# Patient Record
Sex: Male | Born: 1985 | Race: White | Hispanic: No | Marital: Single | Smoking: Current every day smoker
Health system: Southern US, Community
[De-identification: ages and names within clinical notes are randomized; demographics above are authoritative.]

---

## 2011-07-09 ENCOUNTER — Emergency Department (HOSPITAL_COMMUNITY)
Admission: EM | Admit: 2011-07-09 | Discharge: 2011-07-09 | Disposition: A | Discharge status: Home / Self Care | Payer: Self-pay | Attending: Emergency Medicine | Admitting: Emergency Medicine

## 2011-07-09 ENCOUNTER — Encounter (HOSPITAL_COMMUNITY): Payer: Self-pay | Admitting: *Deleted

## 2011-07-09 ENCOUNTER — Emergency Department (HOSPITAL_COMMUNITY): Payer: Self-pay

## 2011-07-09 DIAGNOSIS — M25562 Pain in left knee: Secondary | ICD-10-CM

## 2011-07-09 DIAGNOSIS — IMO0002 Reserved for concepts with insufficient information to code with codable children: Secondary | ICD-10-CM | POA: Insufficient documentation

## 2011-07-09 DIAGNOSIS — M25569 Pain in unspecified knee: Secondary | ICD-10-CM | POA: Insufficient documentation

## 2011-07-09 DIAGNOSIS — M1710 Unilateral primary osteoarthritis, unspecified knee: Secondary | ICD-10-CM | POA: Insufficient documentation

## 2011-07-09 DIAGNOSIS — M1712 Unilateral primary osteoarthritis, left knee: Secondary | ICD-10-CM

## 2011-07-09 MED ORDER — NAPROXEN 250 MG PO TABS: 250.0000 mg | ORAL_TABLET | Freq: Two times a day (BID) | ORAL | Status: DC

## 2011-07-09 MED ORDER — HYDROCODONE-ACETAMINOPHEN 5-325 MG PO TABS: ORAL_TABLET | ORAL | Status: AC

## 2011-07-09 NOTE — ED Provider Notes (Signed)
History     CSN: [id number]  Arrival date & time 07/09/11  2027   First MD Initiated Contact with Patient 07/09/11 2134      Chief Complaint  Patient presents with  . Knee Pain     HPI Pt was seen at 2135.  Per pt, c/o gradual onset and persistence of constant acute flair of his chronic left knee "pain" for the past 1 week.  Denies new injury, no rash, no fevers, no focal motor weakness, no tingling/numbness in extremities.    History reviewed. No pertinent past medical history.  History reviewed. No pertinent past surgical history.   History  Substance Use Topics  . Smoking status: Never Smoker   . Smokeless tobacco: Not on file  . Alcohol Use: No     Review of Systems ROS: Statement: All systems negative except as marked or noted in the HPI; Constitutional: Negative for fever and chills. ; ; Eyes: Negative for eye pain, redness and discharge. ; ; ENMT: Negative for ear pain, hoarseness, nasal congestion, sinus pressure and sore throat. ; ; Cardiovascular: Negative for chest pain, palpitations, diaphoresis, dyspnea and peripheral edema. ; ; Respiratory: Negative for cough, wheezing and stridor. ; ; Gastrointestinal: Negative for nausea, vomiting, diarrhea, abdominal pain, blood in stool, hematemesis, jaundice and rectal bleeding. . ; ; Genitourinary: Negative for dysuria, flank pain and hematuria. ; ; Musculoskeletal: +left knee pain. Negative for back pain and neck pain. Negative for trauma.; ; Skin: Negative for pruritus, rash, abrasions, blisters, bruising and skin lesion.; ; Neuro: Negative for headache, lightheadedness and neck stiffness. Negative for weakness, altered level of consciousness , altered mental status, extremity weakness, paresthesias, involuntary movement, seizure and syncope.     Allergies  Review of patient's allergies indicates no known allergies.  Home Medications  No current outpatient prescriptions on file.  BP 131/76  Pulse 77  Temp(Src) 99 F  (37.2 C) (Oral)  Resp 19  SpO2 98%  Physical Exam 2140: Physical examination:  Nursing notes reviewed; Vital signs and O2 SAT reviewed;  Constitutional: Well developed, Well nourished, Well hydrated, In no acute distress; Head:  Normocephalic, atraumatic; Eyes: EOMI, PERRL, No scleral icterus; ENMT: Mouth and pharynx normal, Mucous membranes moist; Neck: Supple, Full range of motion, No lymphadenopathy; Cardiovascular: Regular rate and rhythm, No murmur, rub, or gallop; Respiratory: Breath sounds clear & equal bilaterally, No rales, rhonchi, wheezes, or rub, Normal respiratory effort/excursion; Chest: Nontender, Movement normal; Extremities: Pulses normal, +decrased ROM (F/E) left knee due to increasing pain.  Pt is able to lift extended LLE off stretcher, and extend left lower leg against resistance.  +mild ACL ligamentous laxity.  No patellar or quad tendon step-offs.  NMS intact left foot, strong pedal pp.  No proximal fibular head tenderness.  No edema, erythema, warmth, ecchymosis or deformity.  No specific area of point tenderness.,  No edema, No calf edema or asymmetry.; Neuro: AA&Ox3, Major CN grossly intact.  No gross focal motor or sensory deficits in extremities.; Skin: Color normal, Warm, Dry, no rash.    ED Course  Procedures   MDM  MDM Reviewed: nursing note and vitals Interpretation: x-ray   Dg Knee Complete 4 Views Left 07/09/2011  *RADIOLOGY REPORT*  Clinical Data: Left knee pain and swelling for 1 week, injury 5 years ago  LEFT KNEE - COMPLETE 4+ VIEW  Comparison: 01/26/2007  Findings: Well corticated ossific/calcific opacities are again noted adjacent to the medial distal femoral condyle.  Mild to moderate tricompartmental degenerative change noted.  No suprapatellar effusion.  No acute fracture or dislocation.  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  Original Report Authenticated By: Harrel Lemon, M.D.      9:46 PM:  Appears to be acute  flair of pt's chronic pain.  Has not f/u with his Ortho MD "in years."  Will give knee immobilizer, crutches, pain meds, and encouragement to f/u with Ortho MD.  Dx testing d/w pt and family.  Questions answered.  Verb understanding, agreeable to d/c home with outpt f/u.       Laray Anger, DO 07/13/11 1128

## 2011-07-09 NOTE — ED Notes (Signed)
He has lt knee pain for 7 days.  He had an injury 5 years ago and he has noticed some swelling  In that area. And he thinks there is fluid in the knee again

## 2011-07-09 NOTE — Discharge Instructions (Signed)
RESOURCE GUIDE  Chronic Pain Problems: Contact [facility]  [phone] Patients need to be referred by their primary care doctor.  Insufficient Money for Medicine: Contact United Way:  call "211" or [facility] [phone].  No Primary Care Doctor: - Call [facility]  [phone] - can help you locate a primary care doctor that  accepts your insurance, provides certain services, etc. - Physician Referral Service- 1-800-533-3463  Agencies that provide inexpensive medical care: - [facility]  [phone] - [facility]  [phone] - [facility]  [phone] - [facility]  [phone] - Planned Parenthood  [phone] - [facility]  [phone]  Medicaid-accepting [place] Providers: - [facility]- [address]  [phone], Mon-Fri 9am-7pm, Sat 9am-1pm - [facility]- [address], [address]  [phone] - [facility]- [address], [address]  [phone] - [facility]- [address] [place] [address]  [phone] - Veita Bland- [address], [phone]  Only accepts [place] Access Medicaid patients after they have their name  applied to their card  Self Pay (no insurance) in [place]: - Sickle Cell Patients: Terry Caldwell, [facility]  [address], [phone] - [facility]- [address]  [phone]       -     [facility] [place]- [address]       -     [facility]- see information above (Speak to Pam H if you do not have insurance)       -  [facility]- [address], [phone]       -  [facility] [place]- [address],  [phone]       -  [facility]- [address] [place] [address], [phone]       -  Terry Osei-[facility]onsu-  [address], [place], [phone]       -  [facility]- [address], [phone]       -  [facility]- [address] [place] [address], [phone], also [address], [phone]       -    [facility]- [address], [phone], 1st & 3rd Saturday   every month, 10am-1pm  1) Find a Doctor and Pay Out of Pocket Although you won't have to find out who is covered by your insurance plan, it is a good idea to ask around and get recommendations. You will then need to call the office and see if the doctor you have chosen will accept you as a new patient and what types of options they offer for patients who are self-pay. Some doctors offer discounts or will set up payment plans for their patients who do not have insurance, but you will need to ask so you aren't surprised when you get to your appointment.  2) Contact Your Local Health Department Not all health departments have doctors that can see patients for sick visits, but many do, so it is worth a call to see if yours does. If you don't know where your local health department is, you can check in your phone book. The CDC also has a tool to help you locate your state's health department, and many state websites also have   listings of all of their local health departments.  3) Find a Walk-in Clinic If your illness is not likely to be very severe or complicated, you may want to try a walk in clinic. These are popping up all over the country in pharmacies, drugstores, and shopping centers. They're usually staffed by nurse practitioners or physician assistants that have been trained to treat common illnesses and complaints. They're usually fairly quick and inexpensive. However, if you have serious medical issues or chronic medical problems, these are probably not your best option  STD Testing - [place] [facility] [place], [facility] Clinic, [address], [place], phone [phone] or 1-877-539-9860.  Monday - Friday, call for an appointment. - [place]  [facility] [place], [facility]D Clinic, [address], [place], phone [phone] or 1-877-539-9860.  Monday - Friday, call for an appointment.  Abuse/Neglect: - [place] Child Abuse Hotline (336) 641-3795 - [place] Child Abuse Hotline 800-378-5315 (After Hours)  Emergency Shelter:  [facility] (336) 271-5985  Maternity Homes: - [facility] (336) 275-9566 - [facility] (704) 372-4663  MRSA Hotline #:   832-7006  [place] Resources  [facility] [place]  [employer] [place] [facility] [address]                 [address]         [address]  [place]                                               [place]                              [place] Phone:  [phone]                                  Phone:  [phone]                   Phone:  [phone]  [place] [facility], [phone] - [place] Services - [facility]- 1-888-581-9988       -     [facility] in [place], [address],                                  336-349-4454, Insurance  [place] Child Abuse Hotline (336) 342-1394 or (336) 342-3537 (After Hours)   Behavioral Health Services  Substance Abuse Resources: - [facility]  336-882-2125 - [facility] 336-784-9470 - [facility] 336-285-9073 - [facility] 336-845-3988 - Residential & Outpatient Substance Abuse Program  800-659-3381  Psychological Services: -  Health  832-9600 - [facility]  [phone] - [place] [facility], [address], [place], ACCESS LINE: 1-800-853-5163 or 336-641-4981, Http://www.[facility].com/services/adult.htm  Dental Assistance  If unable to pay or uninsured, contact:  [facility] or [place] [facility] to become qualified for the adult dental  clinic.  Patients with Medicaid:  [facility] [address], [phone], [phone]  If unable   to pay, or uninsured, contact [facility] 613-798-7566) or [facility] Department [phone] in [place], [phone] in [facility]) to become qualified for the adult dental clinic  Other Low-Cost Community Dental Services: - [facility]- [address] [place], [place], [place], [address], [phone], [address], 2nd and 4th Thursday of the month at 6:30am.  10 clients each day by appointment, can sometimes see walk-in patients if someone does not show for an appointment. [facility]- [address] [place], [place], [address], [phone] - [facility]- [address], [place], [place], [address], [phone] [facility]- [phone] [facility] Health Department- [phone] [phone]    Take the prescriptions as directed.  Apply moist heat or ice to the area(s) of discomfort, for 15 minutes at a time, several times per day for the next few days.  Do not fall asleep on a heating or ice pack.  Call your Orthopedic doctor tomorrow morning to schedule a follow up appointment within the next week.  Return to the Emergency Department immediately if worsening.

## 2011-07-09 NOTE — Progress Notes (Signed)
Orthopedic Tech Progress Note Patient Details:  Terry Caldwell 1985-03-31 [id number]  Ortho Devices Type of Ortho Device: Knee Immobilizer Ortho Device/Splint Location: (L) Ortho Device/Splint Interventions: Application   [name] 07/09/2011, 9:49 PM

## 2011-12-17 ENCOUNTER — Emergency Department (HOSPITAL_COMMUNITY)
Admission: EM | Admit: 2011-12-17 | Discharge: 2011-12-18 | Disposition: A | Discharge status: Home / Self Care | Payer: Self-pay | Attending: Emergency Medicine | Admitting: Emergency Medicine

## 2011-12-17 DIAGNOSIS — Y9339 Activity, other involving climbing, rappelling and jumping off: Secondary | ICD-10-CM | POA: Insufficient documentation

## 2011-12-17 DIAGNOSIS — T148XXA Other injury of unspecified body region, initial encounter: Secondary | ICD-10-CM

## 2011-12-17 DIAGNOSIS — Y929 Unspecified place or not applicable: Secondary | ICD-10-CM | POA: Insufficient documentation

## 2011-12-17 DIAGNOSIS — W2209XA Striking against other stationary object, initial encounter: Secondary | ICD-10-CM | POA: Insufficient documentation

## 2011-12-17 DIAGNOSIS — S335XXA Sprain of ligaments of lumbar spine, initial encounter: Secondary | ICD-10-CM | POA: Insufficient documentation

## 2011-12-17 DIAGNOSIS — IMO0002 Reserved for concepts with insufficient information to code with codable children: Secondary | ICD-10-CM | POA: Insufficient documentation

## 2011-12-17 DIAGNOSIS — M549 Dorsalgia, unspecified: Secondary | ICD-10-CM

## 2011-12-17 MED ORDER — OXYCODONE-ACETAMINOPHEN 5-325 MG PO TABS
2.0000 | ORAL_TABLET | Freq: Once | ORAL | Status: AC
  Administered 2011-12-18: 2 via ORAL
  Filled 2011-12-17: qty 2

## 2011-12-17 NOTE — ED Notes (Addendum)
Patient was climbing a tree for work today around 1700 and slipped, he was wearing a safety harness. He states he fell and hit the trunk of the tree with his lower back. Patient reports having taken tylenol and using a heating pad at home PTA, with no improvement. Patient states he is unable to lay down. He is currently sitting in a straight back chair.

## 2011-12-17 NOTE — ED Notes (Signed)
Pt c/o back pain. Pt was cutting a tree with safety line 30 ft in the tree.pt slipped and the safety line caused the pt to strike the tree 56ft lower than intal point. Pt back struck tree. Pain 10/10

## 2011-12-18 ENCOUNTER — Emergency Department (HOSPITAL_COMMUNITY): Payer: Self-pay

## 2011-12-18 MED ORDER — CYCLOBENZAPRINE HCL 10 MG PO TABS: 10.0000 mg | ORAL_TABLET | Freq: Three times a day (TID) | ORAL | Status: DC | PRN

## 2011-12-18 MED ORDER — OXYCODONE-ACETAMINOPHEN 5-325 MG PO TABS: 1.0000 | ORAL_TABLET | ORAL | Status: DC | PRN

## 2011-12-18 NOTE — ED Provider Notes (Signed)
Medical screening examination/treatment/procedure(s) were performed by non-physician practitioner and as supervising physician I was immediately available for consultation/collaboration.  Olivia Mackie, MD 12/18/11 [phone]

## 2011-12-18 NOTE — ED Notes (Signed)
Discharge instructions reviewed. All questions answered.

## 2011-12-18 NOTE — ED Provider Notes (Signed)
History     CSN: [id number]  Arrival date & time 12/17/11  2247   First MD Initiated Contact with Patient 12/17/11 2323      Chief Complaint  Patient presents with  . Back Pain   HPI  History provided by the patient. Patient is a 26 year old male with no significant PMH who presents with complaints of low back pain and injury. 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 Patient reports colliding with the trunk of the tree over his low back and left side. Patient was able to lower himself to the ground and went to his pickup truck to rest. He reports having increasing low back pain approximately 20 minutes after the injury. Pain has continued to worsen and be persistent. Pain does not radiate. He denies any weakness or numbness in lower extremities. Denies any urinary or fecal incontinence, urinary retention or perineal numbness. He denies any dysuria, hematuria, urinary frequency. He denies any chest pain or shortness of breath.    No past medical history on file.  No past surgical history on file.  No family history on file.  History  Substance Use Topics  . Smoking status: Never Smoker   . Smokeless tobacco: Not on file  . Alcohol Use: No      Review of Systems  HENT: Negative for neck pain.   Gastrointestinal: Negative for nausea, vomiting, abdominal pain and diarrhea.  Genitourinary: Negative for dysuria, frequency, hematuria and flank pain.  Musculoskeletal: Positive for back pain.  Neurological: Negative for weakness, light-headedness, numbness and headaches.    Allergies  Review of patient's allergies indicates no known allergies.  Home Medications   Current Outpatient Rx  Name  Route  Sig  Dispense  Refill  . ACETAMINOPHEN 500 MG PO TABS   Oral   Take 1,000 mg by mouth every 6 (six) hours as needed. Pain.            BP 144/79  Temp 98.5 F (36.9 C) (Oral)  Resp 20  SpO2 97%  Physical Exam  Nursing note and vitals reviewed. Constitutional: He is oriented to person, place, and time. He appears well-developed and well-nourished. No distress.  HENT:  Head: Normocephalic and atraumatic.  Cardiovascular: Normal rate and regular rhythm.   Pulmonary/Chest: Effort normal and breath sounds normal. No respiratory distress. He has no wheezes.  Abdominal: Soft. He exhibits no distension. There is no tenderness. There is no rebound and no guarding.       No CVA tenderness  Musculoskeletal: Normal range of motion. He exhibits no edema and no tenderness.       Cervical back: Normal.       Thoracic back: Normal.       Lumbar back: He exhibits tenderness and bony tenderness. He exhibits no swelling, no edema and no deformity.       Back:  Neurological: He is alert and oriented to person, place, and time. He has normal strength. No sensory deficit. Gait normal.  Skin: Skin is warm.  Psychiatric: He has a normal mood and affect. His behavior is normal.    ED Course  Procedures   Dg Lumbar Spine Complete  12/18/2011  *RADIOLOGY REPORT*  Clinical Data: Low back pain status post trauma.  LUMBAR SPINE - COMPLETE 4+ VIEW  Comparison: None.  Findings: The imaged vertebral  bodies and inter-vertebral disc spaces are maintained. No displaced acute fracture or dislocation identified.   The para-vertebral and overlying soft tissues are within normal limits.  IMPRESSION: No acute osseous abnormality of the lumbar spine.   Original Report Authenticated By: Jearld Lesch, M.D.      1. Back pain   2. Muscle strain       MDM  Patient seen and evaluated. Patient appears uncomfortable but in no acute distress.  X-rays unremarkable without signs of fracture or other concerning injuries. Plan to discharge at this time and treat symptomatically.       Angus Seller, PA 12/18/11 0425

## 2011-12-21 ENCOUNTER — Encounter (HOSPITAL_COMMUNITY): Payer: Self-pay | Admitting: Emergency Medicine

## 2012-02-11 ENCOUNTER — Emergency Department (HOSPITAL_COMMUNITY): Payer: Self-pay

## 2012-02-11 ENCOUNTER — Encounter (HOSPITAL_COMMUNITY): Payer: Self-pay | Admitting: *Deleted

## 2012-02-11 ENCOUNTER — Emergency Department (HOSPITAL_COMMUNITY)
Admission: EM | Admit: 2012-02-11 | Discharge: 2012-02-12 | Disposition: A | Discharge status: Home / Self Care | Payer: Self-pay | Attending: Emergency Medicine | Admitting: Emergency Medicine

## 2012-02-11 DIAGNOSIS — H53149 Visual discomfort, unspecified: Secondary | ICD-10-CM | POA: Insufficient documentation

## 2012-02-11 DIAGNOSIS — R51 Headache: Secondary | ICD-10-CM | POA: Insufficient documentation

## 2012-02-11 DIAGNOSIS — R112 Nausea with vomiting, unspecified: Secondary | ICD-10-CM | POA: Insufficient documentation

## 2012-02-11 DIAGNOSIS — R030 Elevated blood-pressure reading, without diagnosis of hypertension: Secondary | ICD-10-CM | POA: Insufficient documentation

## 2012-02-11 DIAGNOSIS — R1110 Vomiting, unspecified: Secondary | ICD-10-CM

## 2012-02-11 DIAGNOSIS — H538 Other visual disturbances: Secondary | ICD-10-CM | POA: Insufficient documentation

## 2012-02-11 DIAGNOSIS — R42 Dizziness and giddiness: Secondary | ICD-10-CM | POA: Insufficient documentation

## 2012-02-11 LAB — CBC
HCT: 48.3 % (ref 39.0–52.0)
Platelets: 219 10*3/uL (ref 150–400)
RDW: 12.7 % (ref 11.5–15.5)
WBC: 9.1 10*3/uL (ref 4.0–10.5)

## 2012-02-11 LAB — BASIC METABOLIC PANEL
Chloride: 98 mEq/L (ref 96–112)
GFR calc Af Amer: >90 mL/min (ref >90)
Potassium: 3.8 mEq/L (ref 3.5–5.1)
Sodium: 138 mEq/L (ref 135–145)

## 2012-02-11 MED ORDER — METOCLOPRAMIDE HCL 5 MG/ML IJ SOLN
10.0000 mg | Freq: Once | INTRAMUSCULAR | Status: AC
  Administered 2012-02-11: 10 mg via INTRAVENOUS

## 2012-02-11 MED ORDER — KETOROLAC TROMETHAMINE 30 MG/ML IJ SOLN
30.0000 mg | Freq: Once | INTRAMUSCULAR | Status: AC
  Administered 2012-02-11: 30 mg via INTRAVENOUS
  Filled 2012-02-11: qty 1

## 2012-02-11 MED ORDER — SODIUM CHLORIDE 0.9 % IV BOLUS (SEPSIS)
1000.0000 mL | Freq: Once | INTRAVENOUS | Status: AC
  Administered 2012-02-11: 1000 mL via INTRAVENOUS

## 2012-02-11 MED ORDER — ONDANSETRON HCL 4 MG/2ML IJ SOLN
4.0000 mg | Freq: Once | INTRAMUSCULAR | Status: AC
  Administered 2012-02-11: 4 mg via INTRAVENOUS
  Filled 2012-02-11: qty 2

## 2012-02-11 MED ORDER — METOCLOPRAMIDE HCL 5 MG/ML IJ SOLN
10.0000 mg | Freq: Once | INTRAMUSCULAR | Status: DC
  Filled 2012-02-11: qty 2

## 2012-02-11 MED ORDER — DIPHENHYDRAMINE HCL 50 MG/ML IJ SOLN
25.0000 mg | Freq: Once | INTRAMUSCULAR | Status: AC
  Administered 2012-02-11: 25 mg via INTRAVENOUS
  Filled 2012-02-11: qty 1

## 2012-02-11 NOTE — ED Notes (Addendum)
Pt. Transported to CT 

## 2012-02-11 NOTE — ED Provider Notes (Signed)
History     CSN: [id number]  Arrival date & time 02/11/12  2050   First MD Initiated Contact with Patient 02/11/12 2124      Chief Complaint  Patient presents with  . Headache  . Emesis    (Consider location/radiation/quality/duration/timing/severity/associated sxs/prior treatment) HPI Comments: Terry Caldwell is a 27 y.o. Male w no sig PMhx presents to ER c/o severe HA and hyper emesis. Onset of s/s began at about 6pm with an acute onset. Symptoms are moderate. Associated symptoms included nausea, photophobia, sound sensitivity, blurred vision, & dizziness. HA pain located in frontal lobes & described as throbbing sensation with intermittent sharp spikes. Pt states that he normally does not suffer from HA. BP was checked at home and found to be 157/104, and he denies history of suffering from high BP. Pt denies ataxia, dysequilibrium, double vision, unilateral weakness, fevers, night sweats, weight loss, neck pain, head trauma or syncope    The history is provided by the patient.    History reviewed. No pertinent past medical history.  History reviewed. No pertinent past surgical history.  No family history on file.  History  Substance Use Topics  . Smoking status: Never Smoker   . Smokeless tobacco: Not on file  . Alcohol Use: No      Review of Systems  All other systems reviewed and are negative.    Allergies  Review of patient's allergies indicates no known allergies.  Home Medications   Current Outpatient Rx  Name  Route  Sig  Dispense  Refill  . ACETAMINOPHEN 500 MG PO TABS   Oral   Take 1,000 mg by mouth every 6 (six) hours as needed. Pain.         . CYCLOBENZAPRINE HCL 10 MG PO TABS   Oral   Take 1 tablet (10 mg total) by mouth 3 (three) times daily as needed for muscle spasms.   30 tablet   0     BP 136/82  Pulse 92  Temp 97.8 F (36.6 C) (Oral)  Resp 22  SpO2 99%  Physical Exam  Nursing note and vitals reviewed. Constitutional: He is  oriented to person, place, and time. He appears well-developed and well-nourished. He appears distressed.  HENT:  Head: Normocephalic and atraumatic.  Eyes: Conjunctivae normal and EOM are normal. Pupils are equal, round, and reactive to light. No scleral icterus.  Neck: Normal range of motion.       Neck supple with no nuchal rigidity, full pain free ROM. No carotid bruit  Cardiovascular: Normal rate, regular rhythm, normal heart sounds and intact distal pulses.   Pulmonary/Chest: Effort normal and breath sounds normal. No respiratory distress.  Musculoskeletal: Normal range of motion.  Neurological: He is alert and oriented to person, place, and time. He has normal strength.       CN III-XII intact, good coordination, normal gait, strength 5/5 bilaterally, intact distal sensation.  Skin: Skin is warm and dry.       No rash, non diaphoretic    ED Course  Procedures (including critical care time)  Labs Reviewed  CBC - Abnormal; Notable for the following:    Hemoglobin 17.4 (*)     All other components within normal limits  BASIC METABOLIC PANEL  URINALYSIS, ROUTINE W REFLEX MICROSCOPIC   Ct Head Wo Contrast  02/11/2012  *RADIOLOGY REPORT*  Clinical Data: Severe headache, hypertension, vomiting  CT HEAD WITHOUT CONTRAST  Technique:  Contiguous axial images were obtained from the base of the  skull through the vertex without contrast.  Comparison: None.  Findings:  Wallace Cullens white differentiation is maintained.  No CT evidence of acute large territory infarct.  No intraparenchymal or extra-axial mass or hemorrhage.  Normal size and configuration of the ventricles and basilar cisterns.  No midline shift.  There is a minimal amount of mucosal thickening within the left maxillary sinus without air fluid level.  The remaining paranasal sinuses and mastoid air cells are normally aerated.  The regional soft tissues are normal.  No displaced calvarial fracture.  IMPRESSION: 1.  Negative noncontrast head  CT. 2.  Minimal mucosal thickening within the left maxillary sinus without air fluid level.   Original Report Authenticated By: Tacey Ruiz, MD      No diagnosis found.    MDM  Pt to ER c/o of HA c/w migraine, but no hx of similar presentation. CT Imaging preformed without acute abnormality. Pt HA treated and improved while in ED.  Non concerning for [facility] ICH, Meningitis, or temporal arteritis. Pt is afebrile with no focal neuro deficits, nuchal rigidity, or change in vision. Pt is to follow up with PCP to discuss prophylactic medication. Pt verbalizes understanding and is agreeable with plan to dc. Resource guide given at [employer], [place] 02/11/12 2346

## 2012-02-11 NOTE — ED Notes (Signed)
Pt actively vomiting in triage; pt c/o severe headache; bp 157/104 at home; migraine x 4 hours; vomiting x 3 hours; cold sweats

## 2012-02-11 NOTE — ED Provider Notes (Signed)
Medical screening examination/treatment/procedure(s) were performed by non-physician practitioner and as supervising physician I was immediately available for consultation/collaboration.   Dione Booze, MD 02/11/12 2351

## 2012-02-12 LAB — URINALYSIS, ROUTINE W REFLEX MICROSCOPIC
Leukocytes, UA: NEGATIVE
Nitrite: NEGATIVE
Specific Gravity, Urine: 1.037 — ABNORMAL HIGH (ref 1.005–1.030)
pH: 6.5 (ref 5.0–8.0)

## 2012-11-17 ENCOUNTER — Emergency Department (HOSPITAL_COMMUNITY): Payer: Medicaid Other

## 2012-11-17 ENCOUNTER — Encounter (HOSPITAL_COMMUNITY): Payer: Self-pay | Admitting: Emergency Medicine

## 2012-11-17 ENCOUNTER — Emergency Department (HOSPITAL_COMMUNITY)
Admission: EM | Admit: 2012-11-17 | Discharge: 2012-11-17 | Disposition: A | Discharge status: Home / Self Care | Payer: Medicaid Other | Attending: Emergency Medicine | Admitting: Emergency Medicine

## 2012-11-17 DIAGNOSIS — M549 Dorsalgia, unspecified: Secondary | ICD-10-CM

## 2012-11-17 DIAGNOSIS — Y9289 Other specified places as the place of occurrence of the external cause: Secondary | ICD-10-CM | POA: Insufficient documentation

## 2012-11-17 DIAGNOSIS — X503XXA Overexertion from repetitive movements, initial encounter: Secondary | ICD-10-CM | POA: Insufficient documentation

## 2012-11-17 DIAGNOSIS — Y990 Civilian activity done for income or pay: Secondary | ICD-10-CM | POA: Insufficient documentation

## 2012-11-17 DIAGNOSIS — IMO0002 Reserved for concepts with insufficient information to code with codable children: Secondary | ICD-10-CM | POA: Insufficient documentation

## 2012-11-17 DIAGNOSIS — Y9389 Activity, other specified: Secondary | ICD-10-CM | POA: Insufficient documentation

## 2012-11-17 MED ORDER — CYCLOBENZAPRINE HCL 10 MG PO TABS: 10.0000 mg | ORAL_TABLET | Freq: Two times a day (BID) | ORAL | Status: AC | PRN

## 2012-11-17 MED ORDER — KETOROLAC TROMETHAMINE 60 MG/2ML IM SOLN
60.0000 mg | Freq: Once | INTRAMUSCULAR | Status: AC
  Administered 2012-11-17: 60 mg via INTRAMUSCULAR
  Filled 2012-11-17: qty 2

## 2012-11-17 MED ORDER — DEXAMETHASONE SODIUM PHOSPHATE 10 MG/ML IJ SOLN
10.0000 mg | Freq: Once | INTRAMUSCULAR | Status: AC
  Administered 2012-11-17: 10 mg via INTRAMUSCULAR
  Filled 2012-11-17: qty 1

## 2012-11-17 MED ORDER — OXYCODONE-ACETAMINOPHEN 10-325 MG PO TABS: 0.5000 | ORAL_TABLET | ORAL | Status: DC | PRN

## 2012-11-17 NOTE — ED Provider Notes (Signed)
CSN: [id number]     Arrival date & time 11/17/12  1240 History   This chart was scribed for non-physician practitioner Arthor Captain, PA-C working with Vida Roller, MD by Joaquin Music, ED Scribe. This patient was seen in room WTR8/WTR8 and the patient's care was started at 2:32 PM .   Chief Complaint  Patient presents with  . Back Pain    The history is provided by the patient. No language interpreter was used.   HPI Comments: Terry Caldwell is a 27 y.o. male who presents to the Emergency Department complaining of ongoing, worsening back pain with pain that radiates down L extremity onset 3 hours. Pt states he was moving a seat from the [place] and he felt a "pop" in his back. Pt states he is having throbbing back pain that radiates down L extremity. Pt is unable to lean back in seat due to pain. Pt denies loss of bowel and bladder function. Pt denies IV drug use. Pt states he has had this previously occur. 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 Pt denies any other injuries.    History reviewed. No pertinent past medical history. History reviewed. No pertinent past surgical history. History reviewed. No pertinent family history. History  Substance Use Topics  . Smoking status: Never Smoker   . Smokeless tobacco: Current User    Types: Chew  . Alcohol Use: No    Review of Systems  Gastrointestinal: Negative for vomiting.  Musculoskeletal: Positive for back pain and gait problem. Negative for neck stiffness.  Skin: Negative for rash.  Neurological: Negative for weakness, light-headedness and numbness.  All other systems reviewed and are negative.    Allergies  Review of patient's allergies indicates no known allergies.  Home Medications   Current Outpatient Rx  Name  Route  Sig  Dispense  Refill  . acetaminophen (TYLENOL) 500 MG tablet   Oral   Take 1,000 mg by mouth every 6 (six) hours as needed. Pain.          BP 121/69  Pulse 79  Temp(Src) 98.3 F (36.8  C) (Oral)  Resp 17  SpO2 99%  Physical Exam  Constitutional: He is oriented to person, place, and time. He appears well-developed and well-nourished. No distress.  HENT:  Head: Normocephalic and atraumatic.  Right Ear: External ear normal.  Left Ear: External ear normal.  Nose: Nose normal.  Eyes: Conjunctivae are normal. Pupils are equal, round, and reactive to light.  Neck: Neck supple.  Pulmonary/Chest: Effort normal.  Musculoskeletal:  Tender to palpation to Lumbar paraspinal and over SI joint. ROM limited due to pain. DTR normal. Neurovascularly intact.   Neurological: He is alert and oriented to person, place, and time.  Skin: Skin is warm and dry. He is not diaphoretic.  Psychiatric: He has a normal mood and affect.    ED Course  Procedures  DIAGNOSTIC STUDIES: Oxygen Saturation is 99% on RA, normal by my interpretation.    COORDINATION OF CARE: 2:34 PM-Discussed treatment plan which includes administering steroid shot to area and prescribing pt with pain medication and muscle relaxer. Pt agreed to plan.   Labs Review Labs Reviewed - No data to display Imaging Review No results found.  EKG Interpretation   None       MDM   1. Back pain    Patient with back pain.  No neurological deficits and normal neuro exam.  Patient can walk but states is painful.  No loss of bowel or  bladder control.  No concern for cauda equina.  No fever, night sweats, weight loss, h/o cancer, IVDU.  RICE protocol and pain medicine indicated and discussed with patient.    I personally performed the services described in this documentation, which was scribed in my presence. The recorded information has been reviewed and is accurate.    Arthor Captain, PA-C 11/17/12 2107

## 2012-11-17 NOTE — ED Notes (Signed)
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

## 2012-11-17 NOTE — Progress Notes (Signed)
[facility] provided pt with a list of primary care resources. Patient stated that he was pending Medicaid.  °

## 2012-11-21 NOTE — ED Provider Notes (Signed)
Medical screening examination/treatment/procedure(s) were performed by non-physician practitioner and as supervising physician I was immediately available for consultation/collaboration.    Vida Roller, MD 11/21/12 [phone]

## 2013-02-14 ENCOUNTER — Emergency Department (HOSPITAL_COMMUNITY): Payer: Medicaid Other

## 2013-02-14 ENCOUNTER — Encounter (HOSPITAL_COMMUNITY): Payer: Self-pay | Admitting: Emergency Medicine

## 2013-02-14 ENCOUNTER — Emergency Department (HOSPITAL_COMMUNITY)
Admission: EM | Admit: 2013-02-14 | Discharge: 2013-02-14 | Disposition: A | Discharge status: Home / Self Care | Payer: Medicaid Other | Attending: Emergency Medicine | Admitting: Emergency Medicine

## 2013-02-14 DIAGNOSIS — R1031 Right lower quadrant pain: Secondary | ICD-10-CM | POA: Insufficient documentation

## 2013-02-14 DIAGNOSIS — R112 Nausea with vomiting, unspecified: Secondary | ICD-10-CM | POA: Insufficient documentation

## 2013-02-14 DIAGNOSIS — R109 Unspecified abdominal pain: Secondary | ICD-10-CM

## 2013-02-14 DIAGNOSIS — Z79899 Other long term (current) drug therapy: Secondary | ICD-10-CM | POA: Insufficient documentation

## 2013-02-14 LAB — COMPREHENSIVE METABOLIC PANEL
ALBUMIN: 3.9 g/dL (ref 3.5–5.2)
ALT: 34 U/L (ref 0–53)
AST: 20 U/L (ref 0–37)
Alkaline Phosphatase: 61 U/L (ref 39–117)
BUN: 14 mg/dL (ref 6–23)
CALCIUM: 8.7 mg/dL (ref 8.4–10.5)
CO2: 29 mEq/L (ref 19–32)
CREATININE: 0.98 mg/dL (ref 0.50–1.35)
Chloride: 102 mEq/L (ref 96–112)
GFR calc Af Amer: >90 mL/min (ref >90)
Glucose, Bld: 89 mg/dL (ref 70–99)
Potassium: 4 mEq/L (ref 3.7–5.3)
Sodium: 141 mEq/L (ref 137–147)
TOTAL PROTEIN: 6.5 g/dL (ref 6.0–8.3)
Total Bilirubin: 0.9 mg/dL (ref 0.3–1.2)

## 2013-02-14 LAB — URINALYSIS, ROUTINE W REFLEX MICROSCOPIC
Bilirubin Urine: NEGATIVE
GLUCOSE, UA: NEGATIVE mg/dL
Hgb urine dipstick: NEGATIVE
KETONES UR: NEGATIVE mg/dL
LEUKOCYTES UA: NEGATIVE
NITRITE: NEGATIVE
PH: 8 (ref 5.0–8.0)
Protein, ur: 30 mg/dL — AB
SPECIFIC GRAVITY, URINE: 1.031 — AB (ref 1.005–1.030)
Urobilinogen, UA: 1 mg/dL (ref 0.0–1.0)

## 2013-02-14 LAB — CBC WITH DIFFERENTIAL/PLATELET
Basophils Absolute: 0 10*3/uL (ref 0.0–0.1)
Basophils Relative: 0 % (ref 0–1)
Eosinophils Absolute: 0.1 10*3/uL (ref 0.0–0.7)
Eosinophils Relative: 2 % (ref 0–5)
HEMATOCRIT: 49.6 % (ref 39.0–52.0)
HEMOGLOBIN: 17.5 g/dL — AB (ref 13.0–17.0)
LYMPHS ABS: 1.9 10*3/uL (ref 0.7–4.0)
LYMPHS PCT: 30 % (ref 12–46)
MCH: 30.4 pg (ref 26.0–34.0)
MCHC: 35.3 g/dL (ref 30.0–36.0)
MCV: 86.1 fL (ref 78.0–100.0)
MONO ABS: 0.6 10*3/uL (ref 0.1–1.0)
MONOS PCT: 9 % (ref 3–12)
NEUTROS ABS: 3.6 10*3/uL (ref 1.7–7.7)
NEUTROS PCT: 59 % (ref 43–77)
Platelets: 221 10*3/uL (ref 150–400)
RBC: 5.76 MIL/uL (ref 4.22–5.81)
RDW: 13 % (ref 11.5–15.5)
WBC: 6.2 10*3/uL (ref 4.0–10.5)

## 2013-02-14 LAB — URINE MICROSCOPIC-ADD ON

## 2013-02-14 LAB — LIPASE, BLOOD: Lipase: 15 U/L (ref 11–59)

## 2013-02-14 MED ORDER — SODIUM CHLORIDE 0.9 % IV SOLN: 1000.0000 mL | Freq: Once | INTRAVENOUS | Status: DC

## 2013-02-14 MED ORDER — SODIUM CHLORIDE 0.9 % IV SOLN
1000.0000 mL | INTRAVENOUS | Status: DC
Start: 2013-02-14 — End: 2013-02-14
  Administered 2013-02-14 (×2): 1000 mL via INTRAVENOUS

## 2013-02-14 MED ORDER — IOHEXOL 300 MG/ML  SOLN
50.0000 mL | Freq: Once | INTRAMUSCULAR | Status: AC | PRN
  Administered 2013-02-14: 50 mL via ORAL

## 2013-02-14 MED ORDER — ONDANSETRON 8 MG PO TBDP: 8.0000 mg | ORAL_TABLET | Freq: Three times a day (TID) | ORAL | Status: AC | PRN

## 2013-02-14 MED ORDER — OMEPRAZOLE 20 MG PO CPDR
20.0000 mg | DELAYED_RELEASE_CAPSULE | Freq: Every day | ORAL | Status: AC
Start: 2013-02-14 — End: ?

## 2013-02-14 MED ORDER — HYDROMORPHONE HCL PF 1 MG/ML IJ SOLN
1.0000 mg | INTRAMUSCULAR | Status: DC | PRN
  Administered 2013-02-14: 1 mg via INTRAVENOUS
  Filled 2013-02-14: qty 1

## 2013-02-14 MED ORDER — ONDANSETRON HCL 4 MG/2ML IJ SOLN
4.0000 mg | Freq: Once | INTRAMUSCULAR | Status: AC
  Administered 2013-02-14: 4 mg via INTRAVENOUS
  Filled 2013-02-14: qty 2

## 2013-02-14 MED ORDER — IOHEXOL 300 MG/ML  SOLN
100.0000 mL | Freq: Once | INTRAMUSCULAR | Status: AC | PRN
  Administered 2013-02-14: 100 mL via INTRAVENOUS

## 2013-02-14 NOTE — ED Provider Notes (Signed)
CSN: [id number]     Arrival date & time 02/14/13  1010 History   First MD Initiated Contact with Patient 02/14/13 1128     Chief Complaint  Patient presents with  . Abdominal Pain  . Nausea  . Emesis    HPI Patient presents to the emergency room with complaints of abdominal pain and vomiting for the last 3 days. Patient does have history of intermittent issues with vomiting a few times a week for the last several years. Patient had been evaluated before. However, in the last 3 days he's had a significant change for his and vomiting multiple times per day. Maybe ten- 20 times the last 3 days. He has not been able to keep down any food or fluids. He denies any diarrhea or constipation. Patient's also started developed pain in his right lower abdomen. He does have pain in his back as well. Denies any dysuria. He denies any fevers. History reviewed. No pertinent past medical history. History reviewed. No pertinent past surgical history. No family history on file. History  Substance Use Topics  . Smoking status: Never Smoker   . Smokeless tobacco: Current User    Types: Chew  . Alcohol Use: No    Review of Systems  All other systems reviewed and are negative.    Allergies  Review of patient's allergies indicates no known allergies.  Home Medications   Current Outpatient Rx  Name  Route  Sig  Dispense  Refill  . acetaminophen (TYLENOL) 500 MG tablet   Oral   Take 1,000 mg by mouth every 6 (six) hours as needed. Pain.         . cyclobenzaprine (FLEXERIL) 10 MG tablet   Oral   Take 1 tablet (10 mg total) by mouth 2 (two) times daily as needed for muscle spasms.   20 tablet   0   . omeprazole (PRILOSEC) 20 MG capsule   Oral   Take 1 capsule (20 mg total) by mouth daily.   14 capsule   0   . ondansetron (ZOFRAN ODT) 8 MG disintegrating tablet   Oral   Take 1 tablet (8 mg total) by mouth every 8 (eight) hours as needed for nausea or vomiting.   20 tablet   0    BP  140/83  Pulse 85  Temp(Src) 98.6 F (37 C) (Oral)  Resp 20  Ht 6\' 2"  (1.88 m)  Wt 237 lb (107.502 kg)  BMI 30.42 kg/m2  SpO2 98% Physical Exam  Nursing note and vitals reviewed. Constitutional: He appears well-developed and well-nourished. No distress.  HENT:  Head: Normocephalic and atraumatic.  Right Ear: External ear normal.  Left Ear: External ear normal.  Eyes: Conjunctivae are normal. Right eye exhibits no discharge. Left eye exhibits no discharge. No scleral icterus.  Neck: Neck supple. No tracheal deviation present.  Cardiovascular: Normal rate, regular rhythm and intact distal pulses.   Pulmonary/Chest: Effort normal and breath sounds normal. No stridor. No respiratory distress. He has no wheezes. He has no rales.  Abdominal: Soft. Bowel sounds are normal. He exhibits no distension. There is tenderness in the right lower quadrant. There is no rigidity, no rebound and no guarding. No hernia.  Musculoskeletal: He exhibits no edema and no tenderness.  Neurological: He is alert. He has normal strength. No cranial nerve deficit (no facial droop, extraocular movements intact, no slurred speech) or sensory deficit. He exhibits normal muscle tone. He displays no seizure activity. Coordination normal.  Skin: Skin is  warm and dry. No rash noted.  Psychiatric: He has a normal mood and affect.    ED Course  Procedures (including critical care time) Labs Review Labs Reviewed  URINALYSIS, ROUTINE W REFLEX MICROSCOPIC - Abnormal; Notable for the following:    Color, Urine AMBER (*)    Specific Gravity, Urine 1.031 (*)    Protein, ur 30 (*)    All other components within normal limits  CBC WITH DIFFERENTIAL - Abnormal; Notable for the following:    Hemoglobin 17.5 (*)    All other components within normal limits  URINE MICROSCOPIC-ADD ON  COMPREHENSIVE METABOLIC PANEL  LIPASE, BLOOD   Imaging Review Ct Abdomen Pelvis W Contrast  02/14/2013   CLINICAL DATA:  Right lower quadrant  abdominal pain with intermittent nausea and vomiting.  EXAM: CT ABDOMEN AND PELVIS WITH CONTRAST  TECHNIQUE: Multidetector CT imaging of the abdomen and pelvis was performed using the standard protocol following bolus administration of intravenous contrast.  CONTRAST:  50mL OMNIPAQUE IOHEXOL 300 MG/ML SOLN, OMNIPAQUE IOHEXOL 300 MG/ML SOLN  COMPARISON:  No priors.  FINDINGS: Lung Bases: Unremarkable.  Abdomen/Pelvis: The appearance of the liver, gallbladder, pancreas, spleen, bilateral adrenal glands and bilateral kidneys is unremarkable. No significant volume of ascites. No pneumoperitoneum. No pathologic distention of small bowel. Normal appendix (retrocecal). No definite lymphadenopathy identified within the abdomen or pelvis. Prostate gland and urinary bladder are unremarkable in appearance.  Musculoskeletal: There are no aggressive appearing lytic or blastic lesions noted in the visualized portions of the skeleton.  IMPRESSION: 1. No acute findings in the abdomen or pelvis to account for the patient's symptoms. 2. Specifically, the appendix is normal.   Electronically Signed   By: Trudie Reed M.D.   On: 02/14/2013 15:00    EKG Interpretation   None      1343  Pt feeling better with treatment.  On exam, still has ttp rlq.  Will CT scan to evaluate for appendicitis,. MDM   1. Abdominal pain   2. Nausea and vomiting      Workup in the ED is reassuring.  No appendicitis or other acute process on CT.  Will dc home with antacids and antiemetics.  Rec follow up with a PCP, consider GI consultation.    Celene Kras, MD 02/14/13 [phone]

## 2013-02-14 NOTE — ED Notes (Signed)
Resting quietly in bed, wife at bedside, side rails up times 2/  No acute distress noted, no active emesis.

## 2013-02-14 NOTE — ED Notes (Signed)
Pt states for past 3 days he has had intense RLQ pain and lower sacral pain.  Pt states he isn't sure the 2 pain areas are related.  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  Pt also states he has had intermittent N/V for 5 years, but patient states it has gotten worse this week and for the last 3-4 days he has been unable to tolerate oral intake.  Pt's wife states pt has had "projectile" vomiting for last several days.  Pt states he is "fine as long as I don't eat or drink."  Pt denies fever.

## 2013-02-14 NOTE — Discharge Instructions (Signed)
Abdominal Pain, Adult °Many things can cause abdominal pain. Usually, abdominal pain is not caused by a disease and will improve without treatment. It can often be observed and treated at home. Your health care provider will do a physical exam and possibly order blood tests and X-rays to help determine the seriousness of your pain. However, in many cases, more time must pass before a clear cause of the pain can be found. Before that point, your health care provider may not know if you need more testing or further treatment. °HOME CARE INSTRUCTIONS  °Monitor your abdominal pain for any changes. The following actions may help to alleviate any discomfort you are experiencing: °· Only take over-the-counter or prescription medicines as directed by your health care provider. °· Do not take laxatives unless directed to do so by your health care provider. °· Try a clear liquid diet (broth, tea, or water) as directed by your health care provider. Slowly move to a bland diet as tolerated. °SEEK MEDICAL CARE IF: °· You have unexplained abdominal pain. °· You have abdominal pain associated with nausea or diarrhea. °· You have pain when you urinate or have a bowel movement. °· You experience abdominal pain that wakes you in the night. °· You have abdominal pain that is worsened or improved by eating food. °· You have abdominal pain that is worsened with eating fatty foods. °SEEK IMMEDIATE MEDICAL CARE IF:  °· Your pain does not go away within 2 hours. °· You have a fever. °· You keep throwing up (vomiting). °· Your pain is felt only in portions of the abdomen, such as the right side or the left lower portion of the abdomen. °· You pass bloody or black tarry stools. °MAKE SURE YOU: °· Understand these instructions.   °· Will watch your condition.   °· Will get help right away if you are not doing well or get worse.   °Document Released: 10/25/2004 Document Revised: 11/05/2012 Document Reviewed: 09/24/2012 °[employer]® Patient  Information ©2014 [employer]. ° °

## 2013-04-01 ENCOUNTER — Emergency Department (HOSPITAL_COMMUNITY)
Admission: EM | Admit: 2013-04-01 | Discharge: 2013-04-01 | Disposition: A | Discharge status: Home / Self Care | Payer: Medicaid Other | Attending: Emergency Medicine | Admitting: Emergency Medicine

## 2013-04-01 ENCOUNTER — Encounter (HOSPITAL_COMMUNITY): Payer: Self-pay | Admitting: Emergency Medicine

## 2013-04-01 DIAGNOSIS — Z79899 Other long term (current) drug therapy: Secondary | ICD-10-CM | POA: Insufficient documentation

## 2013-04-01 DIAGNOSIS — S61209A Unspecified open wound of unspecified finger without damage to nail, initial encounter: Secondary | ICD-10-CM | POA: Insufficient documentation

## 2013-04-01 DIAGNOSIS — F17200 Nicotine dependence, unspecified, uncomplicated: Secondary | ICD-10-CM | POA: Insufficient documentation

## 2013-04-01 DIAGNOSIS — W260XXA Contact with knife, initial encounter: Secondary | ICD-10-CM | POA: Insufficient documentation

## 2013-04-01 DIAGNOSIS — IMO0002 Reserved for concepts with insufficient information to code with codable children: Secondary | ICD-10-CM

## 2013-04-01 DIAGNOSIS — W261XXA Contact with sword or dagger, initial encounter: Secondary | ICD-10-CM

## 2013-04-01 DIAGNOSIS — Y9389 Activity, other specified: Secondary | ICD-10-CM | POA: Insufficient documentation

## 2013-04-01 DIAGNOSIS — Y929 Unspecified place or not applicable: Secondary | ICD-10-CM | POA: Insufficient documentation

## 2013-04-01 MED ORDER — OXYCODONE-ACETAMINOPHEN 5-325 MG PO TABS
1.0000 | ORAL_TABLET | Freq: Once | ORAL | Status: AC
  Administered 2013-04-01: 1 via ORAL
  Filled 2013-04-01: qty 1

## 2013-04-01 NOTE — ED Notes (Signed)
Wound well approximated. Dressing applied.

## 2013-04-01 NOTE — Discharge Instructions (Signed)
A laceration is a cut or lesion that goes through all layers of the skin and into the tissue just beneath the skin. This may have been repaired by your caregiver.  SEEK MEDICAL ATTENTION IF: There is redness, swelling, increasing pain in the wound  There is a red line that goes up your arm or leg.  Pus is coming from wound.  You develop an unexplained temperature above 100.4.  You notice a foul smell coming from the wound or dressing.  There is a breaking open of the wound (edges not staying together) after sutures have been removed. If you did not receive a tetanus shot today because you thought you were up to date, but did not recall when your last one was given, make sure to check with your primary caregiver to determine if you need one.  WOUND CARE Please have your stitches/staples removed in 7 days or sooner if you have concerns. You may do this at any available urgent care or at your primary care doctor's office.  Keep area clean and dry for 24 hours. Do not remove bandage, if applied.  After 24 hours, remove bandage and wash wound gently with mild soap and warm water. Reapply a new bandage after cleaning wound, if directed.  Continue daily cleansing with soap and water until stitches/staples are removed.  Do not apply any ointments or creams to the wound while stitches/staples are in place, as this may cause delayed healing.  Seek medical careif you experience any of the following signs of infection: Swelling, redness, pus drainage, streaking, fever >101.0 F  Seek care if you experience excessive bleeding that does not stop after 15-20 minutes of constant, firm pressure.

## 2013-04-01 NOTE — ED Notes (Signed)
PA at bedside to stitch patient finger.

## 2013-04-01 NOTE — ED Notes (Signed)
Bleeding controlled placed in soak.

## 2013-04-01 NOTE — ED Notes (Signed)
Rt finger injury after cutting it w/ knife today bleeding a small amt at this time last tetanus l;ast year

## 2013-04-01 NOTE — ED Provider Notes (Signed)
CSN: [id number]     Arrival date & time 04/01/13  1436 History  This chart was scribed for non-physician practitioner, Arthor CaptainAbigail Nolberto Cheuvront, PA-C working with Lyanne CoKevin M Campos, MD by Greggory StallionKayla Andersen, ED scribe. This patient was seen in room TR11C/TR11C and the patient's care was started at 4:35 PM.   Chief Complaint  Patient presents with  . Finger Injury   The history is provided by the patient. No language interpreter was used.   HPI Comments: Terry Caldwell is a 28 y.o. male who presents to the Emergency Department complaining of right middle finger laceration that occurred earlier today around 2:30 PM. Pt was using a knife to poke a hole in something and accidentally cut his finger. He has sudden onset, throbbing right middle finger pain. Denies numbness or tingling. His last tetanus was about one year ago.   History reviewed. No pertinent past medical history. History reviewed. No pertinent past surgical history. No family history on file. History  Substance Use Topics  . Smoking status: Current Every Day Smoker  . Smokeless tobacco: Current User    Types: Chew  . Alcohol Use: No    Review of Systems  Constitutional: Negative for fever.  HENT: Negative for congestion.   Eyes: Negative for redness.  Respiratory: Negative for shortness of breath.   Cardiovascular: Negative for chest pain.  Gastrointestinal: Negative for abdominal distention.  Musculoskeletal: Positive for arthralgias. Negative for gait problem.  Skin: Positive for wound.  Neurological: Negative for numbness.  Psychiatric/Behavioral: Negative for confusion.   Allergies  Review of patient's allergies indicates no known allergies.  Home Medications   Current Outpatient Rx  Name  Route  Sig  Dispense  Refill  . acetaminophen (TYLENOL) 500 MG tablet   Oral   Take 1,000 mg by mouth every 6 (six) hours as needed. Pain.         . cyclobenzaprine (FLEXERIL) 10 MG tablet   Oral   Take 1 tablet (10 mg total) by mouth 2  (two) times daily as needed for muscle spasms.   20 tablet   0   . omeprazole (PRILOSEC) 20 MG capsule   Oral   Take 1 capsule (20 mg total) by mouth daily.   14 capsule   0   . ondansetron (ZOFRAN ODT) 8 MG disintegrating tablet   Oral   Take 1 tablet (8 mg total) by mouth every 8 (eight) hours as needed for nausea or vomiting.   20 tablet   0    BP 144/86  Pulse 101  Temp(Src) 98.4 F (36.9 C) (Oral)  SpO2 96%  Physical Exam  Nursing note and vitals reviewed. Constitutional: He is oriented to person, place, and time. He appears well-developed and well-nourished. No distress.  HENT:  Head: Normocephalic and atraumatic.  Eyes: EOM are normal.  Neck: Neck supple. No tracheal deviation present.  Cardiovascular: Normal rate.   Pulmonary/Chest: Effort normal. No respiratory distress.  Musculoskeletal: Normal range of motion.  2.5 cm elliptical laceration of the right third finger that goes through the proximal nail fold. Nail is intact. Sensation intact.   Neurological: He is alert and oriented to person, place, and time.  Skin: Skin is warm and dry.  Psychiatric: He has a normal mood and affect. His behavior is normal.    ED Course  Procedures (including critical care time)  DIAGNOSTIC STUDIES: Oxygen Saturation is 96% on RA, normal by my interpretation.    COORDINATION OF CARE: 4:38 PM-Discussed treatment plan which includes  laceration repair with pt at bedside and pt agreed to plan.   LACERATION REPAIR PROCEDURE NOTE The patient's identification was confirmed and consent was obtained. This procedure was performed by Arthor Captain, PA-C at 4:38 PM. Site: right third finger Sterile procedures observed Anesthetic used (type and amt): 9 mL 2% lidocaine with epi Suture type/size: 5-0 Prolene Length: 2.5 cm # of Sutures: 7 Technique: running interlocking Complexity: complicated Antibx ointment applied Tetanus UTD Site anesthetized, irrigated with NS, explored  without evidence of foreign body, wound well approximated, site covered with dry, sterile dressing.  Patient tolerated procedure well without complications. Instructions for care discussed verbally and patient provided with additional written instructions for homecare and f/u.  Labs Review Labs Reviewed - No data to display Imaging Review No results found.   EKG Interpretation None      MDM   Final diagnoses:  Laceration    Pt's hand was scrubbed with surgical iodine scrub. Prior to wound cleanse, wound was irrigated with tap water and normal saline. Majority of contamination appeared to be removed before sutures were placed. I did speak with the pt about signs of infection and why he should come back immediately. 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     I personally performed the services described in this documentation, which was scribed in my presence. The recorded information has been reviewed and is accurate.  Arthor Captain, PA-C 04/01/13 1807

## 2013-04-02 NOTE — ED Provider Notes (Signed)
Medical screening examination/treatment/procedure(s) were performed by non-physician practitioner and as supervising physician I was immediately available for consultation/collaboration.   EKG Interpretation None        Brandun Pinn M Chae Oommen, MD 04/02/13 1754 

## 2021-03-11 ENCOUNTER — Emergency Department (HOSPITAL_COMMUNITY): Payer: Self-pay

## 2021-03-11 ENCOUNTER — Encounter (HOSPITAL_COMMUNITY): Payer: Self-pay

## 2021-03-11 ENCOUNTER — Emergency Department (HOSPITAL_COMMUNITY)
Admission: EM | Admit: 2021-03-11 | Discharge: 2021-03-11 | Disposition: A | Discharge status: Home / Self Care | Payer: Self-pay | Attending: Emergency Medicine | Admitting: Emergency Medicine

## 2021-03-11 DIAGNOSIS — Y9339 Activity, other involving climbing, rappelling and jumping off: Secondary | ICD-10-CM | POA: Insufficient documentation

## 2021-03-11 DIAGNOSIS — Y990 Civilian activity done for income or pay: Secondary | ICD-10-CM | POA: Insufficient documentation

## 2021-03-11 DIAGNOSIS — X58XXXA Exposure to other specified factors, initial encounter: Secondary | ICD-10-CM | POA: Insufficient documentation

## 2021-03-11 DIAGNOSIS — S43015A Anterior dislocation of left humerus, initial encounter: Secondary | ICD-10-CM | POA: Insufficient documentation

## 2021-03-11 DIAGNOSIS — S43005A Unspecified dislocation of left shoulder joint, initial encounter: Secondary | ICD-10-CM

## 2021-03-11 DIAGNOSIS — M25519 Pain in unspecified shoulder: Secondary | ICD-10-CM

## 2021-03-11 MED ORDER — MIDAZOLAM HCL 2 MG/2ML IJ SOLN
1.0000 mg | Freq: Once | INTRAMUSCULAR | Status: AC
  Administered 2021-03-11: 1 mg via INTRAVENOUS
  Filled 2021-03-11: qty 2

## 2021-03-11 MED ORDER — PROPOFOL 10 MG/ML IV BOLUS
INTRAVENOUS | Status: AC
  Filled 2021-03-11: qty 40

## 2021-03-11 MED ORDER — PROPOFOL 10 MG/ML IV BOLUS: 45.0000 mg | Freq: Once | INTRAVENOUS | Status: DC

## 2021-03-11 MED ORDER — ETOMIDATE 2 MG/ML IV SOLN
INTRAVENOUS | Status: AC | PRN
Start: 2021-03-11 — End: 2021-03-11
  Administered 2021-03-11: 20 mg via INTRAVENOUS

## 2021-03-11 MED ORDER — MIDAZOLAM HCL 2 MG/2ML IJ SOLN
INTRAMUSCULAR | Status: AC | PRN
  Administered 2021-03-11: 1 mg via INTRAVENOUS

## 2021-03-11 MED ORDER — NAPROXEN 500 MG PO TABS: 500.0000 mg | ORAL_TABLET | Freq: Two times a day (BID) | ORAL | 0 refills | Status: AC

## 2021-03-11 MED ORDER — PROPOFOL 10 MG/ML IV BOLUS
INTRAVENOUS | Status: AC
  Administered 2021-03-11: 90.7 mg via INTRAVENOUS
  Filled 2021-03-11: qty 40

## 2021-03-11 MED ORDER — PROPOFOL 10 MG/ML IV BOLUS: 1.0000 mg/kg | Freq: Once | INTRAVENOUS | Status: AC

## 2021-03-11 MED ORDER — FENTANYL CITRATE PF 50 MCG/ML IJ SOSY
100.0000 ug | PREFILLED_SYRINGE | Freq: Once | INTRAMUSCULAR | Status: AC
  Administered 2021-03-11: 100 ug via INTRAVENOUS
  Filled 2021-03-11: qty 2

## 2021-03-11 MED ORDER — PROPOFOL 10 MG/ML IV BOLUS
INTRAVENOUS | Status: AC | PRN
Start: 2021-03-11 — End: 2021-03-11
  Administered 2021-03-11: 100 mg via INTRAVENOUS

## 2021-03-11 MED ORDER — FENTANYL CITRATE PF 50 MCG/ML IJ SOSY
100.0000 ug | PREFILLED_SYRINGE | Freq: Once | INTRAMUSCULAR | Status: AC
  Administered 2021-03-11: 100 ug via INTRAVENOUS
  Filled 2021-03-11 (×2): qty 2

## 2021-03-11 MED ORDER — LIDOCAINE HCL 2 % IJ SOLN
15.0000 mL | Freq: Once | INTRAMUSCULAR | Status: AC
Start: 2021-03-11 — End: 2021-03-11
  Administered 2021-03-11: 300 mg via INTRADERMAL
  Filled 2021-03-11: qty 20

## 2021-03-11 MED ORDER — ETOMIDATE 2 MG/ML IV SOLN
10.0000 mg | Freq: Once | INTRAVENOUS | Status: AC
  Administered 2021-03-11: 10 mg via INTRAVENOUS
  Filled 2021-03-11: qty 10

## 2021-03-11 NOTE — Discharge Instructions (Signed)
You may alternate taking Tylenol and Naproxen as needed for pain control. You may take Naproxen twice daily as directed on your discharge paperwork and you may take  [phone] mg of Tylenol every 6 hours. Do not exceed 4000 mg of Tylenol daily as this can lead to liver damage. Also, make sure to take Naproxen with meals as it can cause an upset stomach. Do not take other NSAIDs while taking Naproxen such as (Aleve, Ibuprofen, Aspirin, Celebrex, etc) and do not take more than the prescribed dose as this can lead to ulcers and bleeding in your GI tract. You may use warm and cold compresses to help with your symptoms.   Please follow up with the orthopedic doctor, Dr. Roda Shutters, within the next 7-10 days for re-evaluation and further treatment of your symptoms.   Please return to the ER sooner if you have any new or worsening symptoms.

## 2021-03-11 NOTE — ED Provider Triage Note (Signed)
Emergency Medicine Provider Triage Evaluation Note  Terry Caldwell , a 36 y.o. male  was evaluated in triage.  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 Denies that he fell. Hx left shoulder dislocation  Review of Systems  Positive: Shoulder pain Negative: Head injury  Physical Exam  BP (!) 159/114 (BP Location: Right Arm)    Pulse 80    Temp (!) 97.5 F (36.4 C) (Oral)    Resp 20    SpO2 100%  Gen:   Awake, no distress   Resp:  Normal effort  MSK:   Moves extremities without difficulty  Other:  Obvious deformity of the left shoulder  Medical Decision Making  Medically screening exam initiated at 11:21 AM.  Appropriate orders placed.  Ark Agrusa was informed that the remainder of the evaluation will be completed by another provider, this initial triage assessment does not replace that evaluation, and the importance of remaining in the ED until their evaluation is complete.     Karrie Meres, PA-C 03/11/21 1121

## 2021-03-11 NOTE — ED Provider Notes (Signed)
°  Physical Exam  BP (!) 171/101    Pulse 85    Temp (!) 97.5 F (36.4 C) (Oral)    Resp 17    SpO2 100%   Physical Exam  Procedures  .Sedation  Date/Time: 03/11/2021 3:10 PM Performed by: Jacalyn Lefevre, MD Authorized by: Jacalyn Lefevre, MD   Consent:    Consent obtained:  Verbal   Alternatives discussed:  Analgesia without sedation Universal protocol:    Immediately prior to procedure, a time out was called: yes     Patient identity confirmed:  Verbally with patient Indications:    Procedure performed:  Dislocation reduction   Procedure necessitating sedation performed by:  Physician performing sedation Pre-sedation assessment:    Time since last food or drink:  5   ASA classification: class 1 - normal, healthy patient     Mallampati score:  I - soft palate, uvula, fauces, pillars visible   Pre-sedation assessments completed and reviewed: airway patency, cardiovascular function, hydration status, mental status, nausea/vomiting, pain level, respiratory function and temperature   Immediate pre-procedure details:    Reassessment: Patient reassessed immediately prior to procedure     Reviewed: vital signs   Procedure details (see MAR for exact dosages):    Preoxygenation:  Room air   Sedation:  Etomidate, midazolam and propofol   Intended level of sedation: deep   Analgesia:  Morphine   Intra-procedure monitoring:  Blood pressure monitoring, cardiac monitor, continuous capnometry, continuous pulse oximetry, frequent LOC assessments and frequent vital sign checks   Intra-procedure events: none     Total Provider sedation time (minutes):  30 Post-procedure details:    Post-sedation assessment completed:  03/11/2021 2:10 PM   Attendance: Constant attendance by certified staff until patient recovered     Recovery: Patient returned to pre-procedure baseline     Patient is stable for discharge or admission: yes     Procedure completion:  Tolerated well, no immediate  complications  ED Course / MDM    Medical Decision Making Amount and/or Complexity of Data Reviewed Radiology: ordered.  Risk Prescription drug management.          Jacalyn Lefevre, MD 03/11/21 [phone]

## 2021-03-11 NOTE — ED Triage Notes (Addendum)
Pt arrived via POV, states he believes left shoulder is dislocated. Was climbing tree, pain to left shoulder. States same happened x1 wk ago.

## 2021-03-11 NOTE — ED Provider Notes (Signed)
[facility]-EMERGENCY DEPT Provider Note   CSN: [id number] Arrival date & time: 03/11/21  1053     History  Chief Complaint  Patient presents with   Shoulder Injury    Terry Caldwell is a 36 y.o. male.  HPI  36 year old male with a history of substance use, shoulder dislocation, who presents to the emergency department today for evaluation of a left shoulder injury.  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  He actually recently dislocated the left shoulder on 02/16/2021.  He was discharged and told to follow-up with orthopedics however he states he failed to do so.  He has some mildly decreased sensation in the left upper extremity.  Pain was sudden in onset and has been constant since it started.  Pain is severe in nature.  Denies other injuries or fall.  Home Medications Prior to Admission medications   Medication Sig Start Date End Date Taking? Authorizing Provider  naproxen (NAPROSYN) 500 MG tablet Take 1 tablet (500 mg total) by mouth 2 (two) times daily for 7 days. 03/11/21 03/18/21 Yes Joanne Salah S, PA-C  acetaminophen (TYLENOL) 500 MG tablet Take 1,000 mg by mouth every 6 (six) hours as needed. Pain.    [provider]  cyclobenzaprine (FLEXERIL) 10 MG tablet Take 1 tablet (10 mg total) by mouth 2 (two) times daily as needed for muscle spasms. 11/17/12   Arthor Captain, PA-C  omeprazole (PRILOSEC) 20 MG capsule Take 1 capsule (20 mg total) by mouth daily. 02/14/13   Linwood Dibbles, MD  ondansetron (ZOFRAN ODT) 8 MG disintegrating tablet Take 1 tablet (8 mg total) by mouth every 8 (eight) hours as needed for nausea or vomiting. 02/14/13   Linwood Dibbles, MD      Allergies    Patient has no known allergies.    Review of Systems   Review of Systems See HPI for pertinent positives or negatives.   Physical Exam Updated Vital Signs BP (!) 171/101    Pulse 85     Temp (!) 97.5 F (36.4 C) (Oral)    Resp 17    SpO2 100%  Physical Exam Constitutional:      General: He is not in acute distress.    Appearance: He is well-developed.  Eyes:     Conjunctiva/sclera: Conjunctivae normal.  Cardiovascular:     Rate and Rhythm: Normal rate and regular rhythm.  Pulmonary:     Effort: Pulmonary effort is normal.     Breath sounds: Normal breath sounds.  Abdominal:     General: Abdomen is flat.  Musculoskeletal:     Comments: TTP with obvious deformity to the left shoulder. Radial pulse intact. Decreased grip strength to the LUE likely secondary to pain.  Skin:    General: Skin is warm and dry.  Neurological:     Mental Status: He is alert and oriented to person, place, and time.    ED Results / Procedures / Treatments   Labs (all labs ordered are listed, but only abnormal results are displayed) Labs Reviewed - No data to display  EKG None  Radiology DG Shoulder Left  Result Date: 03/11/2021 CLINICAL DATA:  Post reduction images of the left shoulder. EXAM: LEFT SHOULDER - 2+ VIEW COMPARISON:  Pre reduction radiographs, 03/11/2021 at 1:35 p.m. FINDINGS: The dislocation has been reduced, glenohumeral joint now normally aligned. There is a small defect  along the posterolateral humeral head with associated small bony fragments consistent with a Hill-Sachs impaction fracture/deformity. AC joint is normally spaced and aligned. IMPRESSION: Successful reduction of the dislocated left shoulder. Electronically Signed   By: Amie Portland M.D.   On: 03/11/2021 14:35   DG Shoulder Left Portable  Result Date: 03/11/2021 CLINICAL DATA:  Attempted reduction. EXAM: LEFT SHOULDER COMPARISON:  Left shoulder radiograph 03/11/2021 FINDINGS: There is persistent anteroinferior subluxation at the glenohumeral joint. Probable impaction deformity again noted at the posterolateral humeral head. IMPRESSION: Persistent anteroinferior subluxation of the glenohumeral joint.  Electronically Signed   By: Emmaline Kluver M.D.   On: 03/11/2021 14:10   DG Shoulder Left Port  Result Date: 03/11/2021 CLINICAL DATA:  Status post fall from ladder while at work. EXAM: LEFT SHOULDER COMPARISON:  None. FINDINGS: Anterior inferior subluxation of the glenohumeral joint noted. Impaction deformity along the posterolateral humeral head is identified compatible with Hill-Sachs deformity. IMPRESSION: 1. Anterior inferior subluxation of the glenohumeral joint. 2. Hill-Sachs deformity. Electronically Signed   By: Signa Kell M.D.   On: 03/11/2021 12:41    Procedures Reduction of dislocation  Date/Time: 03/11/2021 2:56 PM Performed by: Karrie Meres, PA-C Authorized by: Karrie Meres, PA-C  Consent: Verbal consent obtained. Risks and benefits: risks, benefits and alternatives were discussed Consent given by: patient Patient understanding: patient states understanding of the procedure being performed Patient consent: the patient's understanding of the procedure matches consent given Procedure consent: procedure consent matches procedure scheduled Relevant documents: relevant documents present and verified Test results: test results available and properly labeled Site marked: the operative site was marked Imaging studies: imaging studies available Patient identity confirmed: verbally with patient Time out: Immediately prior to procedure a "time out" was called to verify the correct patient, procedure, equipment, support staff and site/side marked as required. Preparation: Patient was prepped and draped in the usual sterile fashion. Local anesthesia used: yes Anesthesia method: intraarticular injection.  Anesthesia: Local anesthesia used: yes Local Anesthetic: lidocaine 2% without epinephrine Anesthetic total: 10 mL  Sedation: Patient sedated: yes Sedation type: (see Dr. Ceasar Lund note)  Patient tolerance: patient tolerated the procedure well with no immediate  complications      Medications Ordered in ED Medications  fentaNYL (SUBLIMAZE) injection 100 mcg (100 mcg Intravenous Given 03/11/21 1150)  lidocaine (XYLOCAINE) 2 % (with pres) injection 300 mg (300 mg Intradermal Given 03/11/21 1223)  fentaNYL (SUBLIMAZE) injection 100 mcg (100 mcg Intravenous Given 03/11/21 1221)  etomidate (AMIDATE) injection 10 mg (10 mg Intravenous Given 03/11/21 1358)  midazolam (VERSED) injection 1 mg (1 mg Intravenous Given 03/11/21 1358)  propofol (DIPRIVAN) 10 mg/mL bolus/IV push 90.7 mg (90.7 mg Intravenous Given 03/11/21 1359)  propofol (DIPRIVAN) 10 mg/mL bolus/IV push (100 mg Intravenous Given 03/11/21 1345)  midazolam (VERSED) injection (1 mg Intravenous Given 03/11/21 1317)  etomidate (AMIDATE) injection (20 mg Intravenous Given 03/11/21 1317)    ED Course/ Medical Decision Making/ A&P                           Medical Decision Making Amount and/or Complexity of Data Reviewed Radiology: ordered.  Risk Prescription drug management.   This patient presents to the ED for concern of shoulder pain, this involves an extensive number of treatment options, and is a complaint that carries with it a high risk of complications and morbidity.  The differential diagnosis includes shoulder dislocation, long bone fracture   Comorbidities that complicate the patient  evaluation: Patients presentation is complicated by their history of substance use, shoulder dislocation  Social Determinants of Health: Patients  noncompliance with follow up, substance use   increases the complexity of managing their presentation  Additional history obtained: Records reviewed Care Everywhere/External Records  Imaging Studies ordered: I ordered imaging studies including X-ray shoulder   I independently visualized and interpreted imaging which showed  Xray shoulder - 1. Anterior inferior subluxation of the glenohumeral joint. 2. Hill-Sachs deformity  I agree with the radiologist  interpretation  Cardiac Monitoring: The patient was maintained on a cardiac monitor.  I personally viewed and interpreted the cardiac monitor which showed an underlying rhythm of:  sinus rhythm  Medicines ordered and prescription drug management: I ordered medication including fentanyl  for pain  Reevaluation of the patient after these medicines showed that the patient    improved.  Additionally, I numbed the joint with lidocaine and attempted reduction however was unsuccessful.  Patient was then consciously sedated and shoulder reduction was completed.  Critical Interventions:        reduction of shoulder dislocation  Reevaluation: After the interventions noted above, I reevaluated the patient and found that they have :resolved  Complexity of problems addressed: Patients presentation is most consistent with  acute presentation with potential threat to life or bodily function  Disposition: After consideration of the diagnostic results and the patients response to treatment,  I feel that the patent would benefit from discharge home and sling.  Referral to Ortho given.  Patient given Rx for pain medications.  Advised on return precautions.  He voices understanding clear reasons to return.  Questions answered.  Patient stable for discharge. .    Final Clinical Impression(s) / ED Diagnoses Final diagnoses:  Shoulder pain  Dislocation of left shoulder joint, initial encounter    Rx / DC Orders ED Discharge Orders          Ordered    naproxen (NAPROSYN) 500 MG tablet  2 times daily        03/11/21 [address] Elese Rane S, PA-C 03/11/21 1458    Jacalyn Lefevre, MD 03/11/21 1510

## 2023-05-01 IMAGING — DX DG SHOULDER 2+V*L*
3 series · 3 of 3 positions shown · non-contrast
Comparison: Pre reduction radiographs, 03/11/2021 at [DATE] p.m.

CLINICAL DATA: Post reduction images of the left shoulder.

EXAM:
LEFT SHOULDER - 2+ VIEW

[shoulder ap]
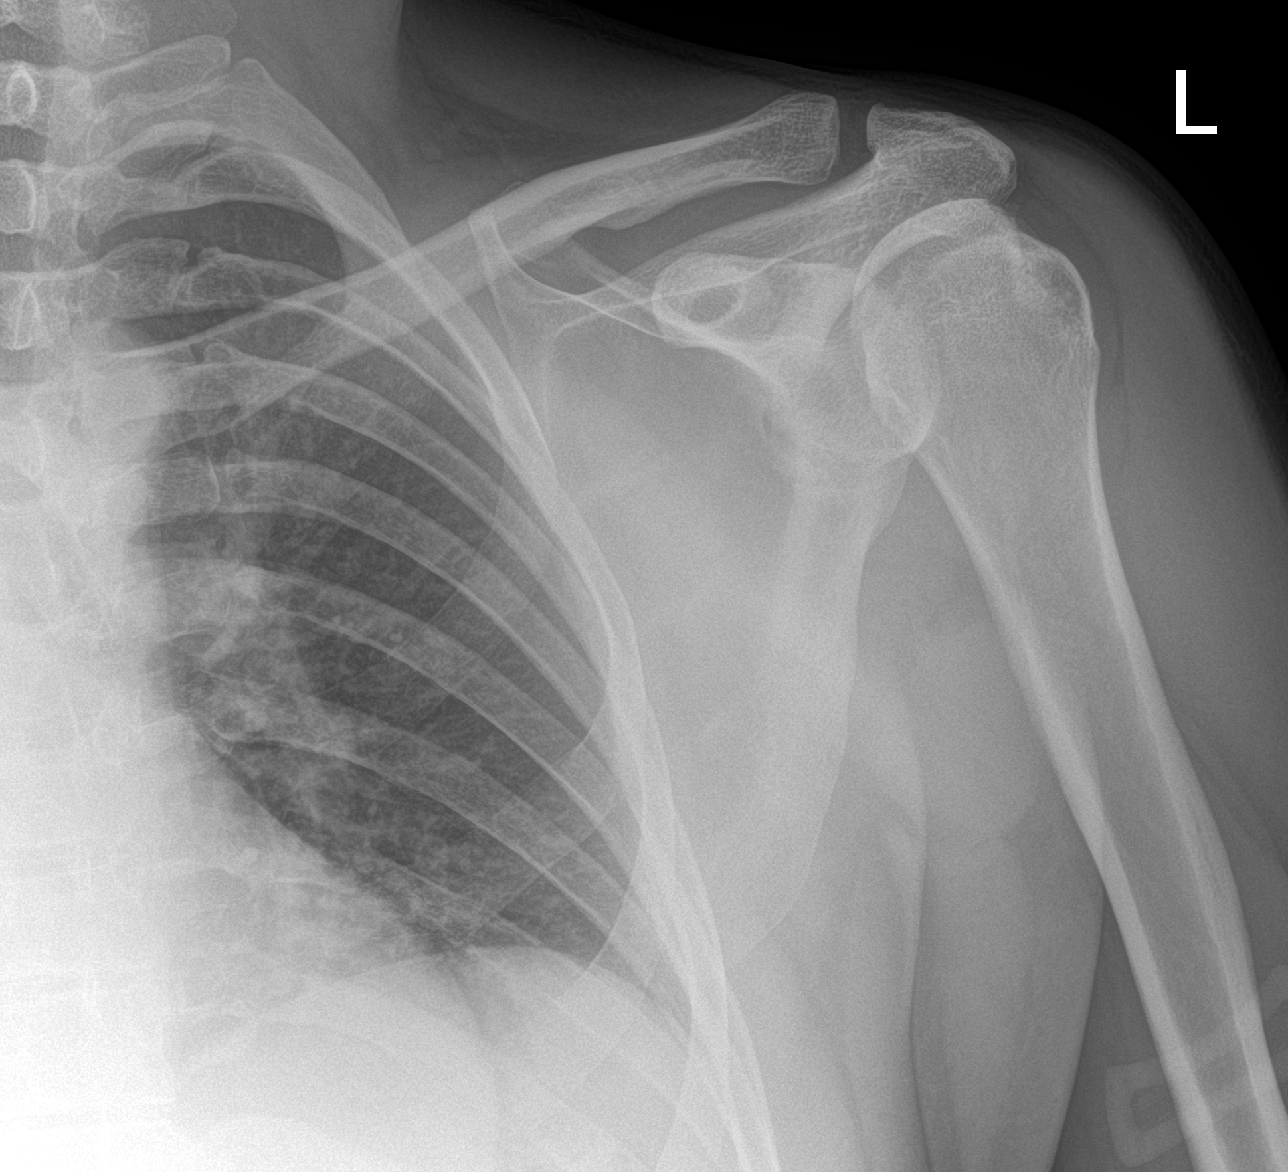

[shoulder obl (1 of 2)]
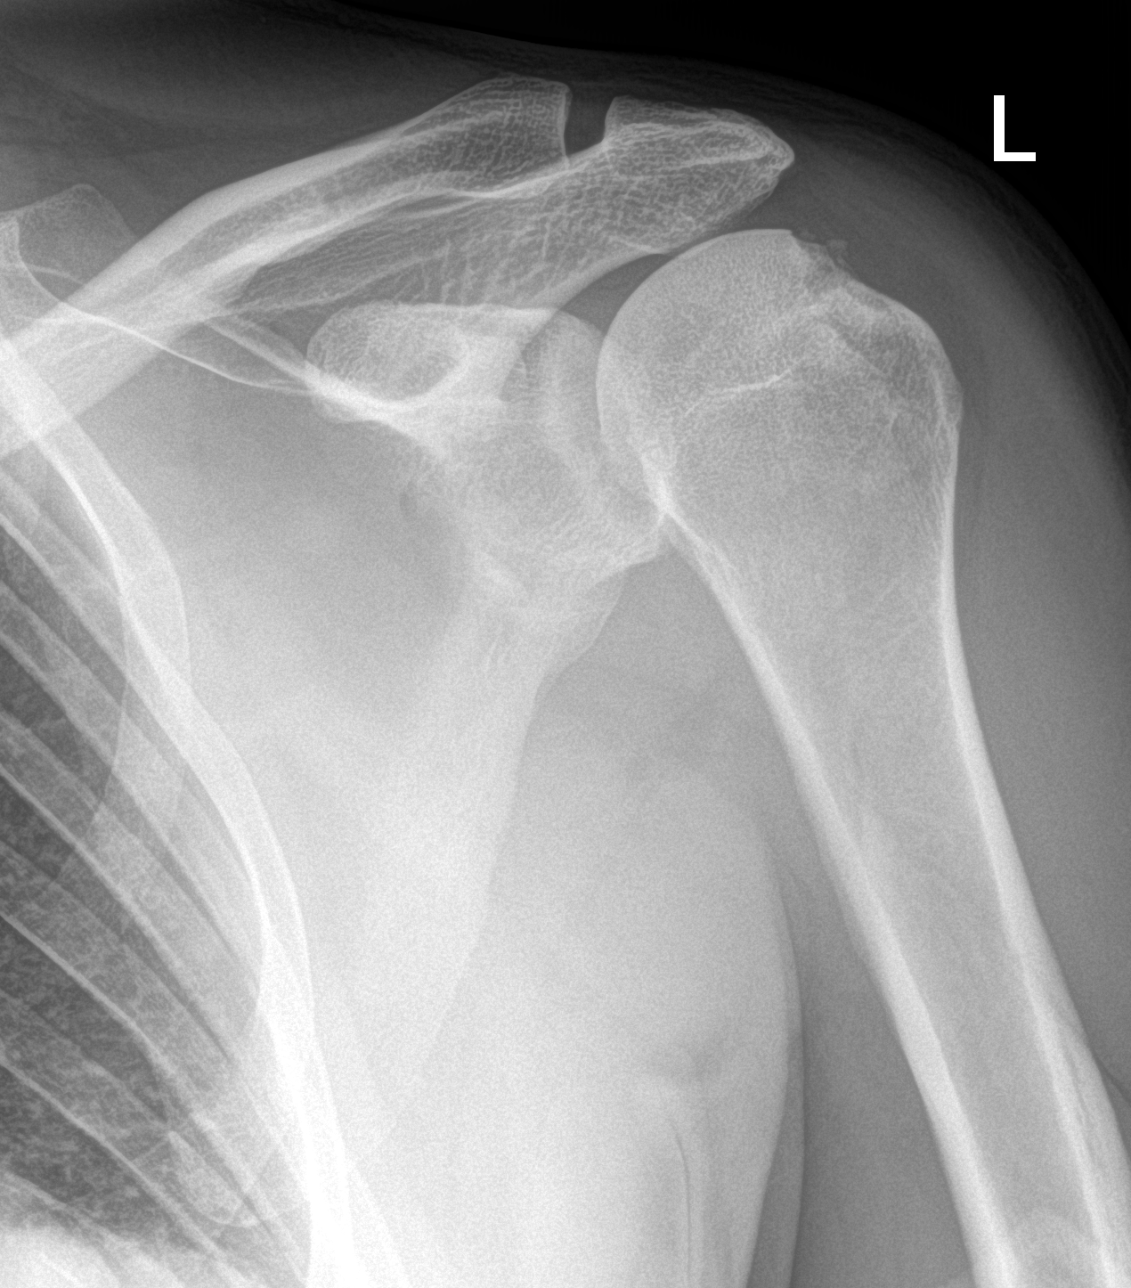

[shoulder obl (2 of 2)]
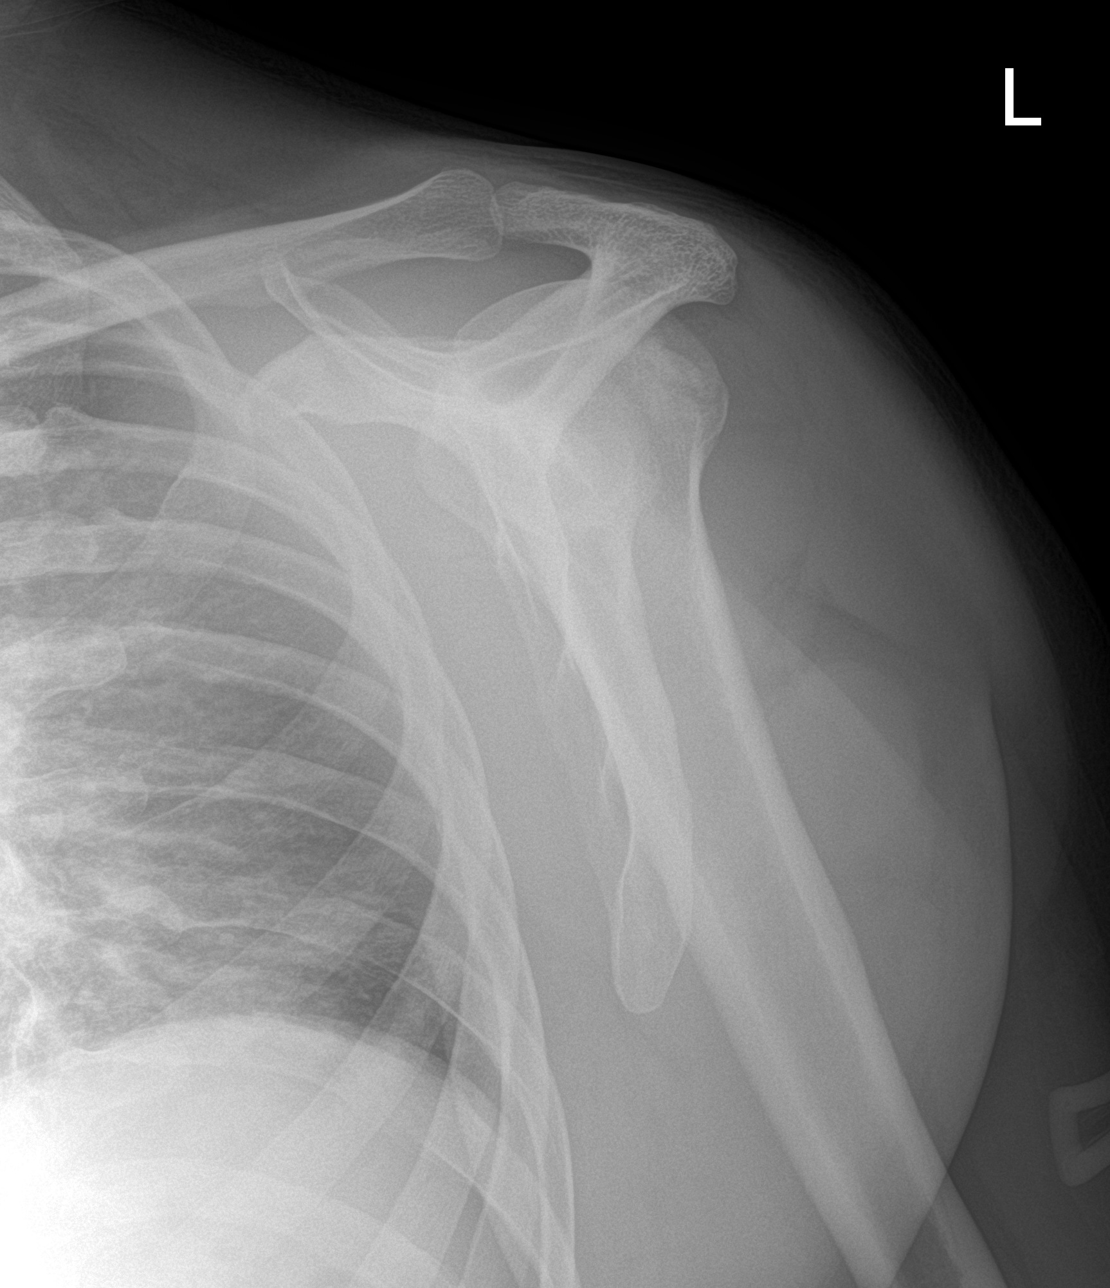

[3 of 3 positions shown; findings below may reference images not displayed]

FINDINGS: The dislocation has been reduced, glenohumeral joint now normally
aligned. There is a small defect along the posterolateral humeral
head with associated small bony fragments consistent with a
Hill-Sachs impaction fracture/deformity.

AC joint is normally spaced and aligned.
IMPRESSION: Successful reduction of the dislocated left shoulder.

## 2023-12-25 ENCOUNTER — Emergency Department (HOSPITAL_BASED_OUTPATIENT_CLINIC_OR_DEPARTMENT_OTHER)
Admission: EM | Admit: 2023-12-25 | Discharge: 2023-12-25 | Disposition: A | Discharge status: Home / Self Care | Payer: MEDICAID | Attending: Emergency Medicine | Admitting: Emergency Medicine

## 2023-12-25 ENCOUNTER — Other Ambulatory Visit: Payer: Self-pay

## 2023-12-25 ENCOUNTER — Encounter (HOSPITAL_BASED_OUTPATIENT_CLINIC_OR_DEPARTMENT_OTHER): Payer: Self-pay

## 2023-12-25 DIAGNOSIS — S51811A Laceration without foreign body of right forearm, initial encounter: Secondary | ICD-10-CM | POA: Insufficient documentation

## 2023-12-25 DIAGNOSIS — W293XXA Contact with powered garden and outdoor hand tools and machinery, initial encounter: Secondary | ICD-10-CM | POA: Insufficient documentation

## 2023-12-25 DIAGNOSIS — S59911A Unspecified injury of right forearm, initial encounter: Secondary | ICD-10-CM | POA: Diagnosis present

## 2023-12-25 MED ORDER — LIDOCAINE HCL (PF) 1 % IJ SOLN
10.0000 mL | Freq: Once | INTRAMUSCULAR | Status: AC
  Administered 2023-12-25: 10 mL
  Filled 2023-12-25: qty 10

## 2023-12-25 MED ORDER — CEPHALEXIN 500 MG PO CAPS: 500.0000 mg | ORAL_CAPSULE | Freq: Four times a day (QID) | ORAL | 0 refills | Status: AC

## 2023-12-25 NOTE — Discharge Instructions (Addendum)
 Have your sutures removed in 10 days.  Keep your wound clean and dry.  Get rechecked for any sign of infection (redness,  Swelling,  Increased pain or drainage of purulent fluid).  I recommend going to any urgent care center for suture removal in 10 days.

## 2023-12-25 NOTE — ED Notes (Signed)
 Lac on the wrist bandaged.SABRASABRA

## 2023-12-25 NOTE — ED Triage Notes (Addendum)
 Pt w approx 3in deep lac to R wrist approx PTA, caused by chainsaw. Bleeding controlled/ oozing at time of triage, +ROM & sensation distal to injury

## 2023-12-25 NOTE — ED Notes (Signed)
 DC paperwork given and verbally understood.

## 2023-12-25 NOTE — ED Provider Notes (Signed)
 [place] EMERGENCY DEPARTMENT AT [facility] Provider Note   CSN: [id number] Arrival date & time: 12/25/23  1340     Patient presents with: Laceration   Terry Caldwell is a 38 y.o. male, right-handed male presenting with laceration to his right distal forearm.  He was using a chainsaw cutting limbs from a tree when he sustained this injury just before arrival.  He denies weakness numbness distal to the injury site.  He can flex and extend all of his fingers and his wrist without weakness or deficit.  He has had no treatment prior to arrival other than dressing application.  He is current with his tetanus.   The history is provided by the patient.       Prior to Admission medications   Medication Sig Start Date End Date Taking? Authorizing Provider  cephALEXin  (KEFLEX ) 500 MG capsule Take 1 capsule (500 mg total) by mouth 4 (four) times daily. 12/25/23  Yes Chaneka Trefz, Mliss, PA-C  acetaminophen  (TYLENOL ) 500 MG tablet Take 1,000 mg by mouth every 6 (six) hours as needed. Pain.    [provider]  cyclobenzaprine  (FLEXERIL ) 10 MG tablet Take 1 tablet (10 mg total) by mouth 2 (two) times daily as needed for muscle spasms. 11/17/12   Harris, Abigail, PA-C  omeprazole  (PRILOSEC) 20 MG capsule Take 1 capsule (20 mg total) by mouth daily. 02/14/13   Randol Simmonds, MD  ondansetron  (ZOFRAN  ODT) 8 MG disintegrating tablet Take 1 tablet (8 mg total) by mouth every 8 (eight) hours as needed for nausea or vomiting. 02/14/13   Randol Simmonds, MD    Allergies: Patient has no known allergies.    Review of Systems  Constitutional:  Negative for chills and fever.  Respiratory:  Negative for shortness of breath and wheezing.   Skin:  Positive for wound.  Neurological:  Negative for weakness and numbness.    Updated Vital Signs BP (!) 142/94 (BP Location: Left Arm)   Pulse (!) 101   Temp 98.2 F (36.8 C)   Resp 18   Ht 6' 1 (1.854 m)   Wt 111.1 kg   SpO2 100%   BMI 32.32 kg/m    Physical Exam Constitutional:      Appearance: He is well-developed.  HENT:     Head: Normocephalic.  Cardiovascular:     Rate and Rhythm: Normal rate.  Pulmonary:     Effort: Pulmonary effort is normal.  Musculoskeletal:        General: Normal range of motion.  Skin:    Findings: Laceration present.     Comments: 6 cm laceration right distal forearm radial edge,  subcutaneous.  Hemostatic.   Irregular.  2 very superficial 1 cm laceration, linear right wrist distal to the larger laceration.   Neurological:     Mental Status: He is alert and oriented to person, place, and time.     Sensory: No sensory deficit.     (all labs ordered are listed, but only abnormal results are displayed) Labs Reviewed - No data to display  EKG: None  Radiology: No results found.   Procedures   LACERATION REPAIR Performed by: Mliss Narrow Authorized by: Mliss Narrow Consent: Verbal consent obtained. Risks and benefits: risks, benefits and alternatives were discussed Consent given by: patient Patient identity confirmed: provided demographic data Prepped and Draped in normal sterile fashion Wound explored  Laceration Location: right forearm  Laceration Length: 6cm  No Foreign Bodies seen or palpated  Anesthesia: local infiltration  Local anesthetic:  lidocaine  1% without epinephrine  Anesthetic total: 8 ml  Irrigation method: syringe Amount of cleaning: copious,  betadine scrub followed by NS syringe irrigation  Skin closure: ethilon 4-0   ,  dermabond  Number of sutures: 10 sutures main laceration,  distal superficial lacs tx with dermabond  Technique: simple interupted.  Patient tolerance: Patient tolerated the procedure well with no immediate complications.  Medications Ordered in the ED  lidocaine  (PF) (XYLOCAINE ) 1 % injection 10 mL (10 mLs Other Given by Other 12/25/23 1511)                                    Medical Decision Making Risk Prescription drug  management.        Final diagnoses:  Laceration of right forearm, initial encounter    ED Discharge Orders          Ordered    cephALEXin  (KEFLEX ) 500 MG capsule  4 times daily        12/25/23 1509               Adden Strout, PA-C 12/25/23 1643    Bernard Drivers, MD 12/31/23 1423
